# Patient Record
Sex: Female | Born: 1985 | Race: White | Hispanic: No | Marital: Married | Smoking: Never smoker
Health system: Southern US, Community
[De-identification: ages and names within clinical notes are randomized; demographics above are authoritative.]

## PROBLEM LIST (undated history)

## (undated) DIAGNOSIS — R319 Hematuria, unspecified: Secondary | ICD-10-CM

## (undated) DIAGNOSIS — O2240 Hemorrhoids in pregnancy, unspecified trimester: Secondary | ICD-10-CM

## (undated) HISTORY — PX: NOSE SURGERY: SHX723

---

## 2016-11-18 DIAGNOSIS — Z0000 Encounter for general adult medical examination without abnormal findings: Secondary | ICD-10-CM | POA: Diagnosis not present

## 2017-02-13 DIAGNOSIS — Z01419 Encounter for gynecological examination (general) (routine) without abnormal findings: Secondary | ICD-10-CM | POA: Diagnosis not present

## 2017-02-13 DIAGNOSIS — Z124 Encounter for screening for malignant neoplasm of cervix: Secondary | ICD-10-CM | POA: Diagnosis not present

## 2017-02-13 DIAGNOSIS — R87610 Atypical squamous cells of undetermined significance on cytologic smear of cervix (ASC-US): Secondary | ICD-10-CM | POA: Diagnosis not present

## 2017-02-13 DIAGNOSIS — Z6821 Body mass index (BMI) 21.0-21.9, adult: Secondary | ICD-10-CM | POA: Diagnosis not present

## 2017-08-08 DIAGNOSIS — Z124 Encounter for screening for malignant neoplasm of cervix: Secondary | ICD-10-CM | POA: Diagnosis not present

## 2018-04-02 DIAGNOSIS — Z3041 Encounter for surveillance of contraceptive pills: Secondary | ICD-10-CM | POA: Diagnosis not present

## 2018-04-02 DIAGNOSIS — K644 Residual hemorrhoidal skin tags: Secondary | ICD-10-CM | POA: Diagnosis not present

## 2018-04-02 DIAGNOSIS — H8111 Benign paroxysmal vertigo, right ear: Secondary | ICD-10-CM | POA: Diagnosis not present

## 2018-10-12 DIAGNOSIS — L989 Disorder of the skin and subcutaneous tissue, unspecified: Secondary | ICD-10-CM | POA: Diagnosis not present

## 2018-10-12 DIAGNOSIS — R300 Dysuria: Secondary | ICD-10-CM | POA: Diagnosis not present

## 2018-10-12 DIAGNOSIS — Z0000 Encounter for general adult medical examination without abnormal findings: Secondary | ICD-10-CM | POA: Diagnosis not present

## 2018-10-22 DIAGNOSIS — R319 Hematuria, unspecified: Secondary | ICD-10-CM | POA: Diagnosis not present

## 2018-11-30 DIAGNOSIS — R319 Hematuria, unspecified: Secondary | ICD-10-CM | POA: Diagnosis not present

## 2019-01-01 DIAGNOSIS — R3121 Asymptomatic microscopic hematuria: Secondary | ICD-10-CM | POA: Diagnosis not present

## 2019-01-18 DIAGNOSIS — R319 Hematuria, unspecified: Secondary | ICD-10-CM | POA: Diagnosis not present

## 2019-01-21 ENCOUNTER — Other Ambulatory Visit: Payer: Self-pay | Admitting: Family Medicine

## 2019-01-21 DIAGNOSIS — R3121 Asymptomatic microscopic hematuria: Secondary | ICD-10-CM

## 2019-01-28 ENCOUNTER — Ambulatory Visit
Admission: RE | Admit: 2019-01-28 | Discharge: 2019-01-28 | Disposition: A | Discharge status: Home / Self Care | Payer: Self-pay | Source: Ambulatory Visit | Attending: Family Medicine | Admitting: Family Medicine

## 2019-01-28 DIAGNOSIS — R3121 Asymptomatic microscopic hematuria: Secondary | ICD-10-CM | POA: Diagnosis not present

## 2019-02-07 DIAGNOSIS — Z20828 Contact with and (suspected) exposure to other viral communicable diseases: Secondary | ICD-10-CM | POA: Diagnosis not present

## 2019-03-21 DIAGNOSIS — Z20828 Contact with and (suspected) exposure to other viral communicable diseases: Secondary | ICD-10-CM | POA: Diagnosis not present

## 2019-10-19 DIAGNOSIS — Z0000 Encounter for general adult medical examination without abnormal findings: Secondary | ICD-10-CM | POA: Diagnosis not present

## 2019-10-19 DIAGNOSIS — Z130 Encounter for screening for diseases of the blood and blood-forming organs and certain disorders involving the immune mechanism: Secondary | ICD-10-CM | POA: Diagnosis not present

## 2019-10-19 DIAGNOSIS — Z13220 Encounter for screening for lipoid disorders: Secondary | ICD-10-CM | POA: Diagnosis not present

## 2019-10-19 DIAGNOSIS — Z131 Encounter for screening for diabetes mellitus: Secondary | ICD-10-CM | POA: Diagnosis not present

## 2019-11-18 DIAGNOSIS — Z6820 Body mass index (BMI) 20.0-20.9, adult: Secondary | ICD-10-CM | POA: Diagnosis not present

## 2019-11-18 DIAGNOSIS — Z1151 Encounter for screening for human papillomavirus (HPV): Secondary | ICD-10-CM | POA: Diagnosis not present

## 2019-11-18 DIAGNOSIS — Z01419 Encounter for gynecological examination (general) (routine) without abnormal findings: Secondary | ICD-10-CM | POA: Diagnosis not present

## 2020-03-29 DIAGNOSIS — Z20828 Contact with and (suspected) exposure to other viral communicable diseases: Secondary | ICD-10-CM | POA: Diagnosis not present

## 2020-04-07 DIAGNOSIS — J209 Acute bronchitis, unspecified: Secondary | ICD-10-CM | POA: Diagnosis not present

## 2020-09-24 DIAGNOSIS — Z20822 Contact with and (suspected) exposure to covid-19: Secondary | ICD-10-CM | POA: Diagnosis not present

## 2020-10-26 DIAGNOSIS — Z0000 Encounter for general adult medical examination without abnormal findings: Secondary | ICD-10-CM | POA: Diagnosis not present

## 2020-10-26 DIAGNOSIS — Z130 Encounter for screening for diseases of the blood and blood-forming organs and certain disorders involving the immune mechanism: Secondary | ICD-10-CM | POA: Diagnosis not present

## 2021-02-15 DIAGNOSIS — K529 Noninfective gastroenteritis and colitis, unspecified: Secondary | ICD-10-CM | POA: Diagnosis not present

## 2021-04-05 DIAGNOSIS — Z319 Encounter for procreative management, unspecified: Secondary | ICD-10-CM | POA: Diagnosis not present

## 2021-04-19 DIAGNOSIS — Z319 Encounter for procreative management, unspecified: Secondary | ICD-10-CM | POA: Diagnosis not present

## 2021-05-09 IMAGING — US US RENAL
1 series · 14 of 25 positions shown · non-contrast
Comparison: None

CLINICAL DATA: Asymptomatic microscopic hematuria

EXAM:
RENAL / URINARY TRACT ULTRASOUND COMPLETE

[Series 1: us renal · 0.23mm/px · 14 of 38 slices shown]
[im 1/38]
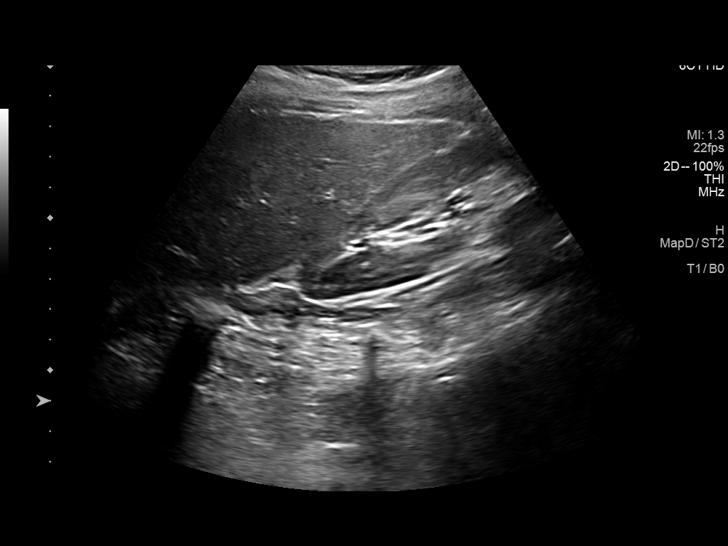
[im 4/38]
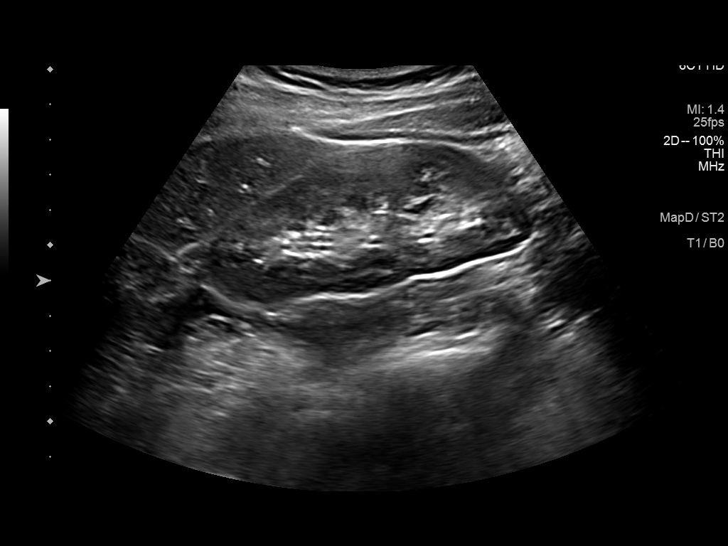
[im 7/38]
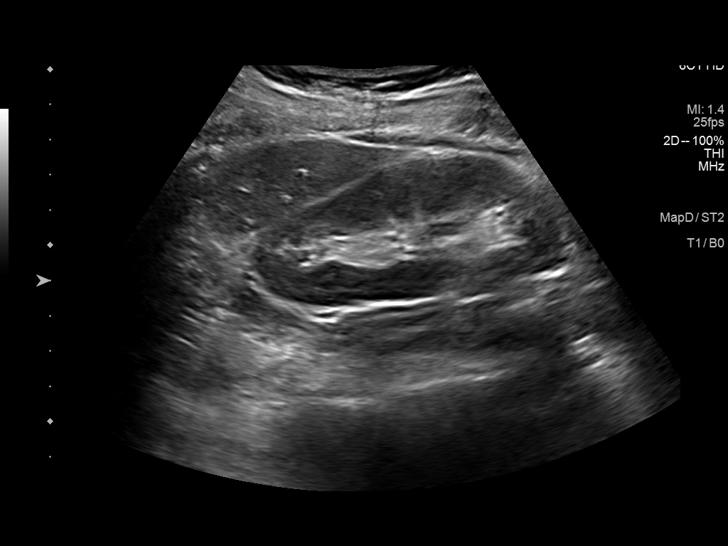
[im 10/38]
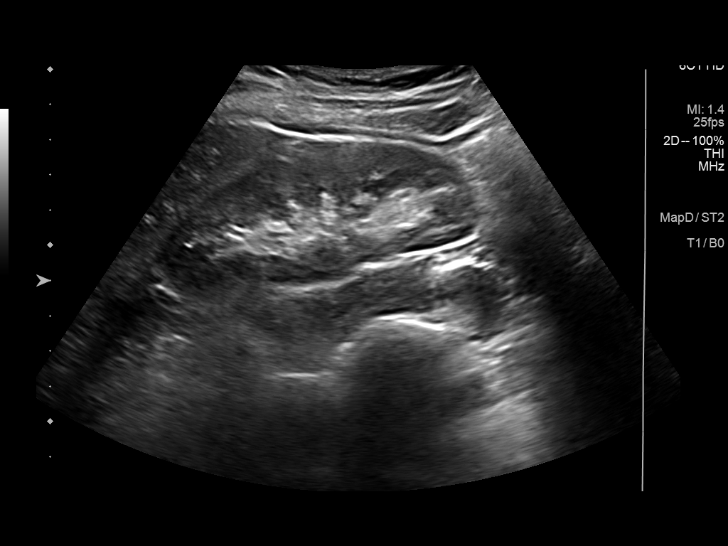
[im 13/38]
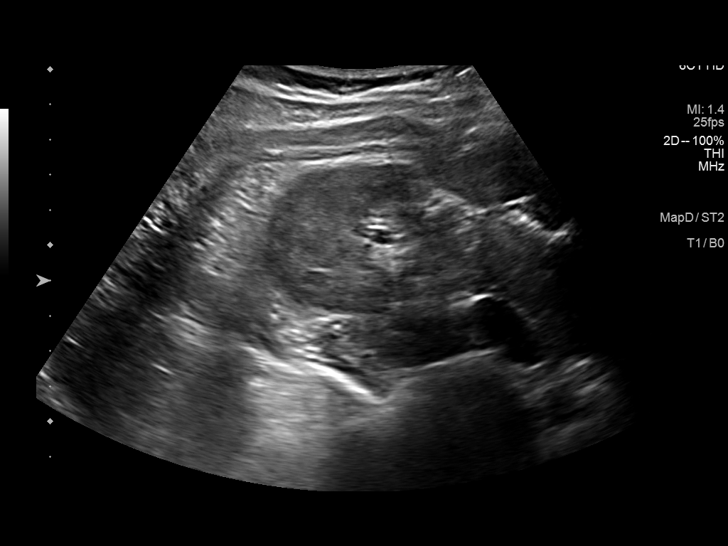
[im 14/38]
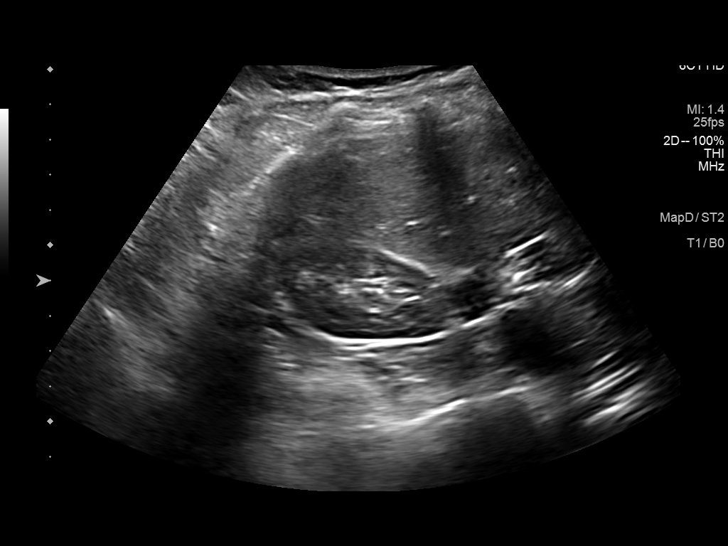
[im 17/38]
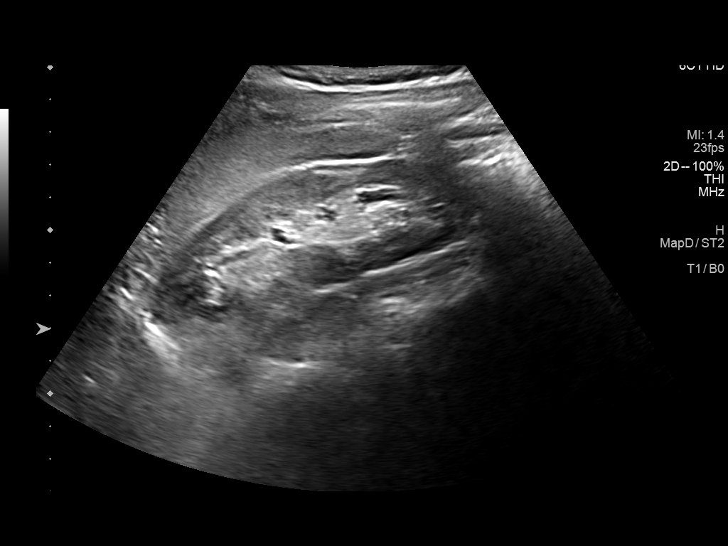
[im 21/38]
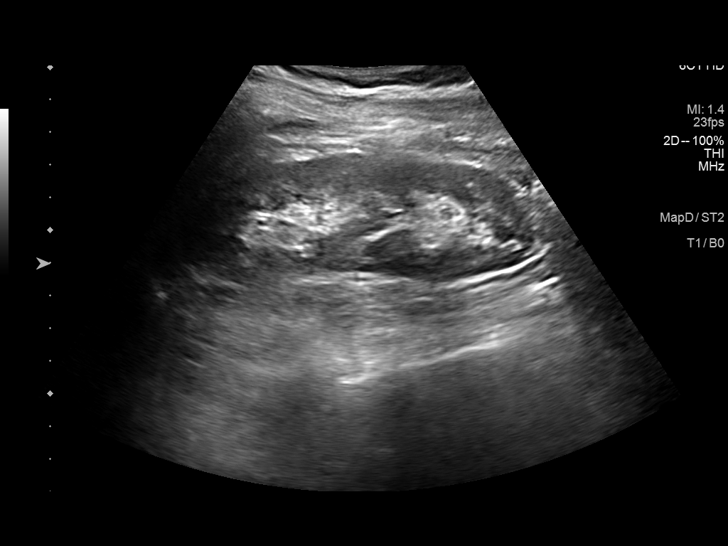
[im 24/38]
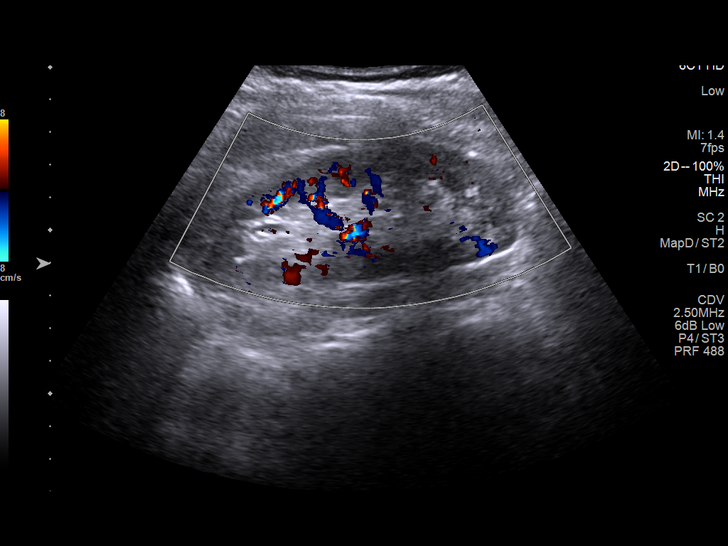
[im 25/38]
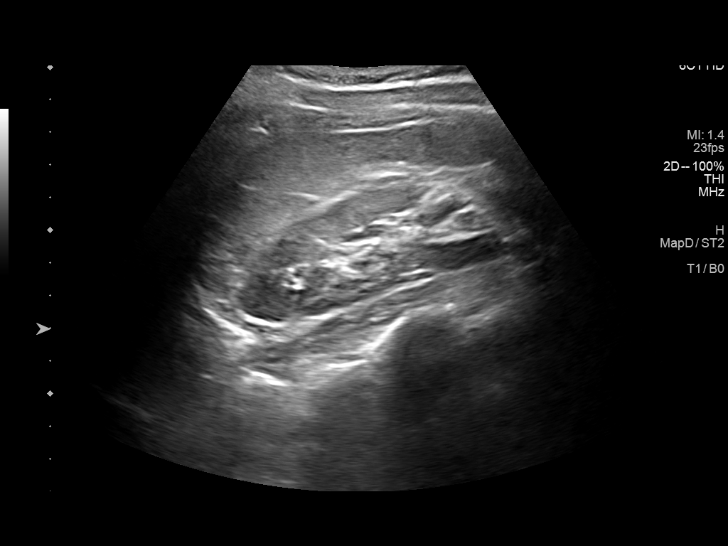
[im 28/38]
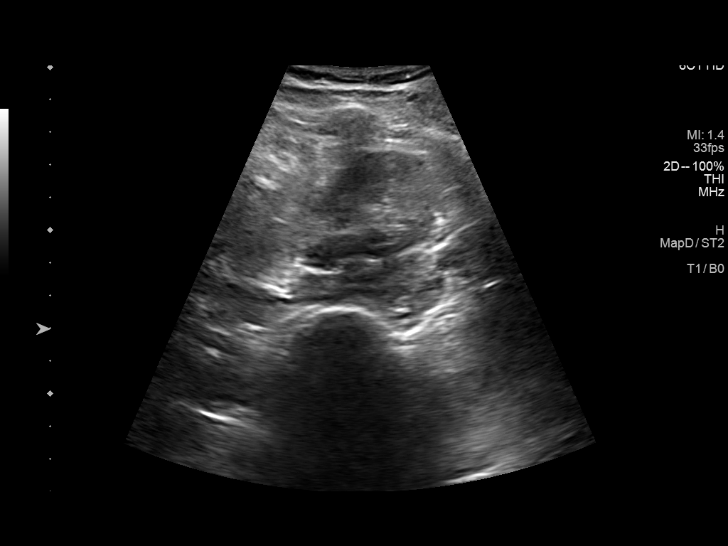
[im 31/38]
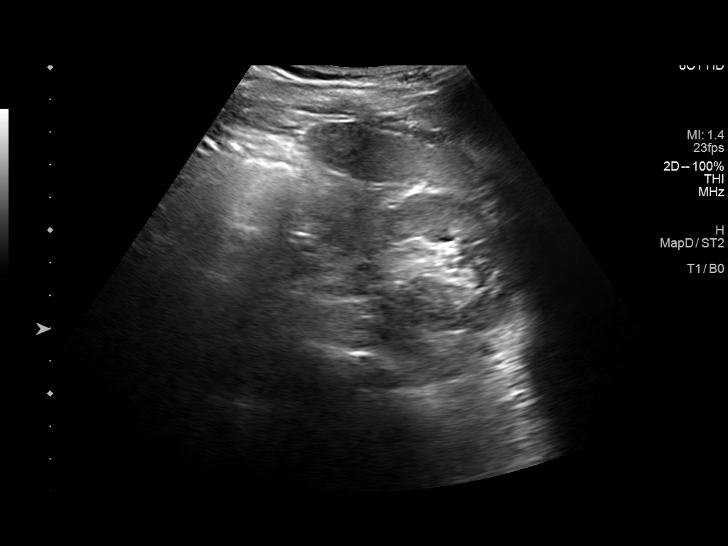
[im 34/38]
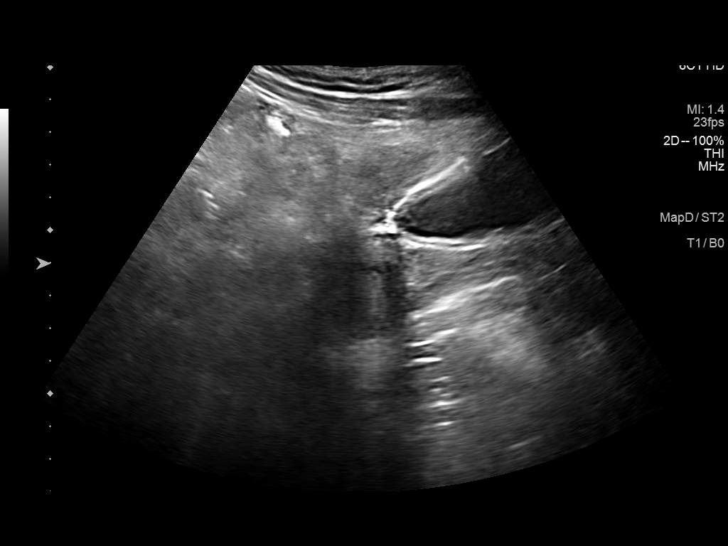
[im 38/38]
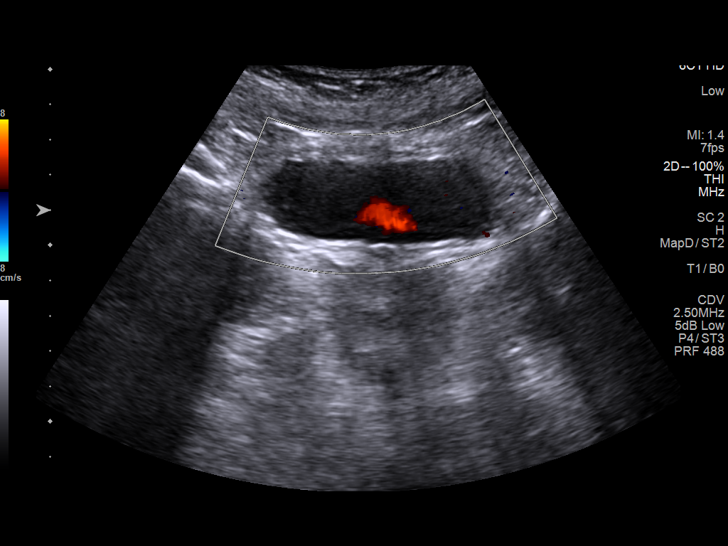

[14 of 25 positions shown; findings below may reference images not displayed]

FINDINGS: Right Kidney:

Renal measurements: 9.4 x 4.2 x 4.5 cm = volume: 91 mL. Normal
cortical thickness and echogenicity. No mass, hydronephrosis, or
shadowing calcification.

Left Kidney:

Renal measurements: 10.7 x 3.7 x 4.2 cm = volume: 88 mL. Normal
cortical thickness and echogenicity for technique. No mass,
hydronephrosis, or shadowing calcification.

Bladder:

Appears normal for degree of bladder distention. BILATERAL ureteral
jets noted.

Other:

N/A
IMPRESSION: Normal exam.

## 2021-08-09 DIAGNOSIS — Z3200 Encounter for pregnancy test, result unknown: Secondary | ICD-10-CM | POA: Diagnosis not present

## 2021-08-09 DIAGNOSIS — O26859 Spotting complicating pregnancy, unspecified trimester: Secondary | ICD-10-CM | POA: Diagnosis not present

## 2021-08-13 DIAGNOSIS — O26859 Spotting complicating pregnancy, unspecified trimester: Secondary | ICD-10-CM | POA: Diagnosis not present

## 2021-08-23 DIAGNOSIS — Z3201 Encounter for pregnancy test, result positive: Secondary | ICD-10-CM | POA: Diagnosis not present

## 2021-09-21 DIAGNOSIS — Z3689 Encounter for other specified antenatal screening: Secondary | ICD-10-CM | POA: Diagnosis not present

## 2021-09-21 DIAGNOSIS — Z3401 Encounter for supervision of normal first pregnancy, first trimester: Secondary | ICD-10-CM | POA: Diagnosis not present

## 2021-09-21 DIAGNOSIS — Z113 Encounter for screening for infections with a predominantly sexual mode of transmission: Secondary | ICD-10-CM | POA: Diagnosis not present

## 2021-09-21 LAB — OB RESULTS CONSOLE HEPATITIS B SURFACE ANTIGEN: Hepatitis B Surface Ag: NEGATIVE

## 2021-09-21 LAB — OB RESULTS CONSOLE GC/CHLAMYDIA
Chlamydia: NEGATIVE
Neisseria Gonorrhea: NEGATIVE

## 2021-09-21 LAB — OB RESULTS CONSOLE RUBELLA ANTIBODY, IGM: Rubella: IMMUNE

## 2021-09-21 LAB — HEPATITIS C ANTIBODY: HCV Ab: NEGATIVE

## 2021-09-21 LAB — OB RESULTS CONSOLE RPR: RPR: NONREACTIVE

## 2021-09-21 LAB — OB RESULTS CONSOLE HIV ANTIBODY (ROUTINE TESTING): HIV: NONREACTIVE

## 2021-10-25 DIAGNOSIS — Z361 Encounter for antenatal screening for raised alphafetoprotein level: Secondary | ICD-10-CM | POA: Diagnosis not present

## 2021-10-25 DIAGNOSIS — Z3A15 15 weeks gestation of pregnancy: Secondary | ICD-10-CM | POA: Diagnosis not present

## 2021-11-19 DIAGNOSIS — R8290 Unspecified abnormal findings in urine: Secondary | ICD-10-CM | POA: Diagnosis not present

## 2021-11-19 DIAGNOSIS — Z363 Encounter for antenatal screening for malformations: Secondary | ICD-10-CM | POA: Diagnosis not present

## 2022-01-14 DIAGNOSIS — Z23 Encounter for immunization: Secondary | ICD-10-CM | POA: Diagnosis not present

## 2022-02-04 DIAGNOSIS — Z23 Encounter for immunization: Secondary | ICD-10-CM | POA: Diagnosis not present

## 2022-02-04 DIAGNOSIS — Z3689 Encounter for other specified antenatal screening: Secondary | ICD-10-CM | POA: Diagnosis not present

## 2022-02-18 DIAGNOSIS — O2613 Low weight gain in pregnancy, third trimester: Secondary | ICD-10-CM | POA: Diagnosis not present

## 2022-02-18 DIAGNOSIS — Z3A32 32 weeks gestation of pregnancy: Secondary | ICD-10-CM | POA: Diagnosis not present

## 2022-03-22 ENCOUNTER — Other Ambulatory Visit: Payer: Self-pay | Admitting: Obstetrics and Gynecology

## 2022-03-22 DIAGNOSIS — O321XX9 Maternal care for breech presentation, other fetus: Secondary | ICD-10-CM | POA: Diagnosis not present

## 2022-03-22 DIAGNOSIS — Z3A36 36 weeks gestation of pregnancy: Secondary | ICD-10-CM | POA: Diagnosis not present

## 2022-03-26 ENCOUNTER — Encounter (HOSPITAL_COMMUNITY): Payer: Self-pay

## 2022-03-26 DIAGNOSIS — Z3689 Encounter for other specified antenatal screening: Secondary | ICD-10-CM | POA: Diagnosis not present

## 2022-03-26 NOTE — Patient Instructions (Signed)
BECKIE VISCARDI  03/26/2022   Your procedure is scheduled on:  04/07/2022  Arrive at 0730 at Entrance C on [employer] at [facility]  and [employer]. You are invited to use the FREE valet parking or use the Visitor's parking deck.  Pick up the phone at the desk and dial [phone].  Call this number if you have problems the morning of surgery: [phone]  Remember:   Do not eat food:(After Midnight) Desps de medianoche.  Do not drink clear liquids: (After Midnight) Desps de medianoche.  Take these medicines the morning of surgery with A SIP OF WATER:  none   Do not wear jewelry, make-up or nail polish.  Do not wear lotions, powders, or perfumes. Do not wear deodorant.  Do not shave 48 hours prior to surgery.  Do not bring valuables to the hospital.  [facility] is not   responsible for any belongings or valuables brought to the hospital.  Contacts, dentures or bridgework may not be worn into surgery.  Leave suitcase in the car. After surgery it may be brought to your room.  For patients admitted to the hospital, checkout time is 11:00 AM the day of              discharge.      Please read over the following fact sheets that you were given:     Preparing for Surgery

## 2022-03-27 ENCOUNTER — Other Ambulatory Visit: Payer: Self-pay | Admitting: Obstetrics & Gynecology

## 2022-03-27 ENCOUNTER — Ambulatory Visit: Payer: BC Managed Care – PPO | Attending: Obstetrics & Gynecology

## 2022-03-27 ENCOUNTER — Ambulatory Visit (HOSPITAL_BASED_OUTPATIENT_CLINIC_OR_DEPARTMENT_OTHER): Payer: BC Managed Care – PPO | Admitting: Maternal & Fetal Medicine

## 2022-03-27 ENCOUNTER — Ambulatory Visit: Payer: BC Managed Care – PPO | Admitting: *Deleted

## 2022-03-27 VITALS — BP 104/61 | HR 92 | Ht 62.0 in

## 2022-03-27 DIAGNOSIS — O3509X0 Maternal care for (suspected) other central nervous system malformation or damage in fetus, not applicable or unspecified: Secondary | ICD-10-CM

## 2022-03-27 DIAGNOSIS — Q048 Other specified congenital malformations of brain: Secondary | ICD-10-CM | POA: Diagnosis not present

## 2022-03-27 DIAGNOSIS — Z363 Encounter for antenatal screening for malformations: Secondary | ICD-10-CM

## 2022-03-27 DIAGNOSIS — Z3A37 37 weeks gestation of pregnancy: Secondary | ICD-10-CM | POA: Insufficient documentation

## 2022-03-27 NOTE — Progress Notes (Signed)
MFM Consult Note Patient Name: Janet Gibbs  Patient MRN:   [id number]  Referring provider: Erling Conte  Reason for Consult: Ventriculomegaly  HPI: Janet Gibbs is a 36 y.o. G1P0 at [redacted]w[redacted]d here for ultrasound and consultation.   Mild unilateral ventriculomegaly was seen on today's ultrasound.  The ventricle measures 11 millimeters (left) and 9 mm (right) and this is consistent with mild ventriculomegaly on the left. The choroids are not dangling within the lateral ventricles which is reassuring. The CSP was noted indicating a normal corpus callosum.  The brain parenchyma appears normal without signs of hemorrhage or tumor. The third ventricle appears normal. The posterior was normal without signs of spinabidfida although due to the gestational age the spine was not well seen. There is no evidence of soft markers or periventricular or hepatic calcifications. Fetal growth is normal. She has had negative toxo screening but not CMV.   I discussed the today's ultrasound findings are consistent with mild ventriculomegaly.  Some of the ultrasound views show bilateral ventriculomegaly while others only show unilateral ventriculomegaly.  Overall the measurements are mild and there is no other signs of intracranial abnormalities.  I discussed the various causes of ventriculomegaly but reassured the patient that this is most likely normal but that postnatal assessment is needed to monitor the newborn for any further workup.  I discussed that in general if there is moderate to severe urgent ventriculomegaly a fetal echo and fetal MRI are recommended but due to the late gestational age and the mild presentation that this is most likely not going to be beneficial and can be deferred at this time.  I also discussed the role of amniocentesis in assessing for CMV and toxoplasmosis in addition to a chromosomal MicroArray.  This was declined which I think is reasonable given the advanced gestation.  I discussed that  in the event this is mild ventriculomegaly there is some association with developmental and behavioral abnormalities but the severity is often difficult to appreciate.   Review of Systems: A review of systems was performed and was negative except per HPI   Vitals and Physical Exam See intake sheet for vitals Sitting comfortably on the sonogram table Nonlabored breathing Normal rate and rhythm Abdomen is nontender  Genetic testing: low risk NIPS  Sonographic findings Single intrauterine pregnancy. Observed fetal cardiac activity. Breech presentation. Fetal anatomy that was well seen appears normal except for mild left sided ventriculomegaly. The ventricle measures 11 millimeters (left) and 9 mm (right) and this is consistent with mild ventriculomegaly on the left. The choroids are not dangling within the lateral ventricles which is reassuring. The CSP was noted indicating a normal corpus callosum.  The brain parenchyma appears normal without signs of hemorrhage or tumor. The third ventricle appears normal. The posterior was normal without signs of spinabidfida although due to the gestational age the spine was not well seen. There is no evidence of soft markers or periventricular or hepatic calcifications. Fetal biometry shows the estimated fetal weight at the 72 percentile.  Amniotic fluid volume: Within normal limits. Placenta: Posterior. Cervix: Not visualized (advanced GA >24wks). Adnexa: No abnormality visualized.  Assessment - Ventriculomegaly, mild Plan -Due to gestational age a fetal echo, MRI and serial ultrasounds are not likely to be beneficial. -Notify pediatric team about mild ventriculomegaly after birth.  I discussed that potentially they would likely assess the newborn for CMV, toxoplasmosis and possibly genetic conditions associated with ventriculomegaly. - The mode of delivery should be individualized. In isolated VM without  significant head enlargement, the mode of  delivery should not be influenced by VM. In cases of severe VM with enlargement of fetal head size (head circumference >40 mm), cesarean birth may be indicated.  Due to persistent breech presentation in addition to the patient declining external cephalic version, a cesarean delivery is going to be scheduled between 39 and 40 weeks per the patient.   I spent 60 minutes reviewing the patients chart, including labs and images as well as counseling the patient about her medical conditions.  Braxton Feathers  MFM, [place]   03/27/2022  4:05 PM

## 2022-04-05 ENCOUNTER — Encounter (HOSPITAL_COMMUNITY)
Admission: RE | Admit: 2022-04-05 | Discharge: 2022-04-05 | Disposition: A | Discharge status: Home / Self Care | Payer: BC Managed Care – PPO | Source: Ambulatory Visit | Attending: Obstetrics and Gynecology | Admitting: Obstetrics and Gynecology

## 2022-04-05 DIAGNOSIS — Q048 Other specified congenital malformations of brain: Secondary | ICD-10-CM | POA: Diagnosis not present

## 2022-04-05 DIAGNOSIS — Z01818 Encounter for other preprocedural examination: Secondary | ICD-10-CM | POA: Insufficient documentation

## 2022-04-05 DIAGNOSIS — Z23 Encounter for immunization: Secondary | ICD-10-CM | POA: Diagnosis not present

## 2022-04-05 DIAGNOSIS — O9902 Anemia complicating childbirth: Secondary | ICD-10-CM | POA: Diagnosis not present

## 2022-04-05 DIAGNOSIS — Z3A39 39 weeks gestation of pregnancy: Secondary | ICD-10-CM | POA: Diagnosis not present

## 2022-04-05 DIAGNOSIS — O358XX0 Maternal care for other (suspected) fetal abnormality and damage, not applicable or unspecified: Secondary | ICD-10-CM | POA: Diagnosis not present

## 2022-04-05 DIAGNOSIS — O99824 Streptococcus B carrier state complicating childbirth: Secondary | ICD-10-CM | POA: Diagnosis not present

## 2022-04-05 DIAGNOSIS — O321XX0 Maternal care for breech presentation, not applicable or unspecified: Secondary | ICD-10-CM | POA: Diagnosis not present

## 2022-04-05 HISTORY — DX: Hemorrhoids in pregnancy, unspecified trimester: O22.40

## 2022-04-05 HISTORY — DX: Hematuria, unspecified: R31.9

## 2022-04-05 LAB — CBC
HCT: 37.5 % (ref 36.0–46.0)
Hemoglobin: 12.3 g/dL (ref 12.0–15.0)
MCH: 30 pg (ref 26.0–34.0)
MCHC: 32.8 g/dL (ref 30.0–36.0)
MCV: 91.5 fL (ref 80.0–100.0)
Platelets: 272 10*3/uL (ref 150–400)
RBC: 4.1 MIL/uL (ref 3.87–5.11)
RDW: 13 % (ref 11.5–15.5)
WBC: 9.7 10*3/uL (ref 4.0–10.5)
nRBC: 0 % (ref 0.0–0.2)

## 2022-04-05 LAB — TYPE AND SCREEN
ABO/RH(D): B POS
Antibody Screen: NEGATIVE

## 2022-04-05 LAB — RPR: RPR Ser Ql: NONREACTIVE

## 2022-04-07 ENCOUNTER — Encounter (HOSPITAL_COMMUNITY)
Admission: RE | Disposition: A | Discharge status: Home / Self Care | Payer: Self-pay | Source: Home / Self Care | Attending: Obstetrics and Gynecology

## 2022-04-07 ENCOUNTER — Other Ambulatory Visit: Payer: Self-pay

## 2022-04-07 ENCOUNTER — Encounter (HOSPITAL_COMMUNITY): Payer: Self-pay | Admitting: Obstetrics and Gynecology

## 2022-04-07 ENCOUNTER — Inpatient Hospital Stay (HOSPITAL_COMMUNITY): Payer: BC Managed Care – PPO | Admitting: Anesthesiology

## 2022-04-07 ENCOUNTER — Inpatient Hospital Stay (HOSPITAL_COMMUNITY)
Admission: RE | Admit: 2022-04-07 | Discharge: 2022-04-10 | DRG: 788 | Disposition: A | Discharge status: Home / Self Care | Payer: BC Managed Care – PPO | Attending: Obstetrics and Gynecology | Admitting: Obstetrics and Gynecology

## 2022-04-07 DIAGNOSIS — O358XX0 Maternal care for other (suspected) fetal abnormality and damage, not applicable or unspecified: Secondary | ICD-10-CM | POA: Diagnosis present

## 2022-04-07 DIAGNOSIS — O321XX0 Maternal care for breech presentation, not applicable or unspecified: Principal | ICD-10-CM | POA: Diagnosis present

## 2022-04-07 DIAGNOSIS — Z3A39 39 weeks gestation of pregnancy: Secondary | ICD-10-CM | POA: Diagnosis not present

## 2022-04-07 DIAGNOSIS — O99824 Streptococcus B carrier state complicating childbirth: Secondary | ICD-10-CM | POA: Diagnosis present

## 2022-04-07 DIAGNOSIS — B951 Streptococcus, group B, as the cause of diseases classified elsewhere: Secondary | ICD-10-CM | POA: Diagnosis present

## 2022-04-07 DIAGNOSIS — O9902 Anemia complicating childbirth: Secondary | ICD-10-CM | POA: Diagnosis present

## 2022-04-07 DIAGNOSIS — Z98891 History of uterine scar from previous surgery: Secondary | ICD-10-CM

## 2022-04-07 LAB — ABO/RH: ABO/RH(D): B POS

## 2022-04-07 SURGERY — Surgical Case
Anesthesia: Spinal

## 2022-04-07 MED ORDER — SCOPOLAMINE 1 MG/3DAYS TD PT72
MEDICATED_PATCH | TRANSDERMAL | Status: DC | PRN
  Administered 2022-04-07: 1 via TRANSDERMAL

## 2022-04-07 MED ORDER — ACETAMINOPHEN 10 MG/ML IV SOLN
INTRAVENOUS | Status: AC
  Filled 2022-04-07: qty 100

## 2022-04-07 MED ORDER — OXYTOCIN-SODIUM CHLORIDE 30-0.9 UT/500ML-% IV SOLN
2.5000 [IU]/h | INTRAVENOUS | Status: AC
  Administered 2022-04-07: 2.5 [IU]/h via INTRAVENOUS
  Filled 2022-04-07: qty 500

## 2022-04-07 MED ORDER — KETOROLAC TROMETHAMINE 30 MG/ML IJ SOLN
30.0000 mg | Freq: Four times a day (QID) | INTRAMUSCULAR | Status: AC
  Administered 2022-04-07 – 2022-04-08 (×3): 30 mg via INTRAVENOUS
  Filled 2022-04-07 (×2): qty 1

## 2022-04-07 MED ORDER — MORPHINE SULFATE (PF) 0.5 MG/ML IJ SOLN
INTRAMUSCULAR | Status: AC
  Filled 2022-04-07: qty 10

## 2022-04-07 MED ORDER — PHENYLEPHRINE 80 MCG/ML (10ML) SYRINGE FOR IV PUSH (FOR BLOOD PRESSURE SUPPORT)
PREFILLED_SYRINGE | INTRAVENOUS | Status: AC
  Filled 2022-04-07: qty 10

## 2022-04-07 MED ORDER — ONDANSETRON HCL 4 MG/2ML IJ SOLN
INTRAMUSCULAR | Status: AC
  Filled 2022-04-07: qty 2

## 2022-04-07 MED ORDER — WITCH HAZEL-GLYCERIN EX PADS: 1.0000 | MEDICATED_PAD | CUTANEOUS | Status: DC | PRN

## 2022-04-07 MED ORDER — HYDROMORPHONE HCL 1 MG/ML IJ SOLN: 0.2000 mg | INTRAMUSCULAR | Status: DC | PRN

## 2022-04-07 MED ORDER — SCOPOLAMINE 1 MG/3DAYS TD PT72: 1.0000 | MEDICATED_PATCH | Freq: Once | TRANSDERMAL | Status: DC

## 2022-04-07 MED ORDER — KETOROLAC TROMETHAMINE 30 MG/ML IJ SOLN
30.0000 mg | Freq: Once | INTRAMUSCULAR | Status: AC | PRN
  Administered 2022-04-07: 30 mg via INTRAVENOUS

## 2022-04-07 MED ORDER — DIBUCAINE (PERIANAL) 1 % EX OINT: 1.0000 | TOPICAL_OINTMENT | CUTANEOUS | Status: DC | PRN

## 2022-04-07 MED ORDER — PHENYLEPHRINE HCL-NACL 20-0.9 MG/250ML-% IV SOLN
INTRAVENOUS | Status: DC | PRN
  Administered 2022-04-07: 60 ug/min via INTRAVENOUS

## 2022-04-07 MED ORDER — MORPHINE SULFATE (PF) 0.5 MG/ML IJ SOLN
INTRAMUSCULAR | Status: DC | PRN
  Administered 2022-04-07: 150 ug via INTRATHECAL

## 2022-04-07 MED ORDER — ACETAMINOPHEN 500 MG PO TABS: 1000.0000 mg | ORAL_TABLET | Freq: Four times a day (QID) | ORAL | Status: DC

## 2022-04-07 MED ORDER — FENTANYL CITRATE (PF) 100 MCG/2ML IJ SOLN
INTRAMUSCULAR | Status: AC
  Filled 2022-04-07: qty 2

## 2022-04-07 MED ORDER — POVIDONE-IODINE 10 % EX SWAB
2.0000 | Freq: Once | CUTANEOUS | Status: AC
  Administered 2022-04-07: 2 via TOPICAL

## 2022-04-07 MED ORDER — DIPHENHYDRAMINE HCL 50 MG/ML IJ SOLN: 12.5000 mg | INTRAMUSCULAR | Status: DC | PRN

## 2022-04-07 MED ORDER — LACTATED RINGERS IV SOLN: INTRAVENOUS | Status: DC

## 2022-04-07 MED ORDER — NALOXONE HCL 4 MG/10ML IJ SOLN: 1.0000 ug/kg/h | INTRAVENOUS | Status: DC | PRN

## 2022-04-07 MED ORDER — SENNOSIDES-DOCUSATE SODIUM 8.6-50 MG PO TABS
2.0000 | ORAL_TABLET | Freq: Every day | ORAL | Status: DC
  Filled 2022-04-07: qty 2

## 2022-04-07 MED ORDER — SIMETHICONE 80 MG PO CHEW: 80.0000 mg | CHEWABLE_TABLET | ORAL | Status: DC | PRN

## 2022-04-07 MED ORDER — COCONUT OIL OIL: 1.0000 | TOPICAL_OIL | Status: DC | PRN

## 2022-04-07 MED ORDER — DEXAMETHASONE SODIUM PHOSPHATE 4 MG/ML IJ SOLN
INTRAMUSCULAR | Status: AC
  Filled 2022-04-07: qty 2

## 2022-04-07 MED ORDER — KETOROLAC TROMETHAMINE 30 MG/ML IJ SOLN: 30.0000 mg | Freq: Four times a day (QID) | INTRAMUSCULAR | Status: AC | PRN

## 2022-04-07 MED ORDER — OXYTOCIN-SODIUM CHLORIDE 30-0.9 UT/500ML-% IV SOLN
INTRAVENOUS | Status: DC | PRN
  Administered 2022-04-07: 400 mL via INTRAVENOUS

## 2022-04-07 MED ORDER — PHENYLEPHRINE HCL (PRESSORS) 10 MG/ML IV SOLN
INTRAVENOUS | Status: DC | PRN
  Administered 2022-04-07 (×2): 80 ug via INTRAVENOUS

## 2022-04-07 MED ORDER — KETOROLAC TROMETHAMINE 30 MG/ML IJ SOLN
INTRAMUSCULAR | Status: AC
  Filled 2022-04-07: qty 1

## 2022-04-07 MED ORDER — SODIUM CHLORIDE 0.9 % IR SOLN
Status: DC | PRN
  Administered 2022-04-07: 1

## 2022-04-07 MED ORDER — ACETAMINOPHEN 500 MG PO TABS
1000.0000 mg | ORAL_TABLET | Freq: Four times a day (QID) | ORAL | Status: DC
  Administered 2022-04-07 – 2022-04-10 (×10): 1000 mg via ORAL
  Filled 2022-04-07 (×11): qty 2

## 2022-04-07 MED ORDER — SODIUM CHLORIDE 0.9% FLUSH: 3.0000 mL | INTRAVENOUS | Status: DC | PRN

## 2022-04-07 MED ORDER — ZOLPIDEM TARTRATE 5 MG PO TABS: 5.0000 mg | ORAL_TABLET | Freq: Every evening | ORAL | Status: DC | PRN

## 2022-04-07 MED ORDER — ACETAMINOPHEN 10 MG/ML IV SOLN
INTRAVENOUS | Status: DC | PRN
  Administered 2022-04-07: 1000 mg via INTRAVENOUS

## 2022-04-07 MED ORDER — SCOPOLAMINE 1 MG/3DAYS TD PT72
MEDICATED_PATCH | TRANSDERMAL | Status: AC
  Filled 2022-04-07: qty 1

## 2022-04-07 MED ORDER — HYDROMORPHONE HCL 1 MG/ML IJ SOLN: 0.2500 mg | INTRAMUSCULAR | Status: DC | PRN

## 2022-04-07 MED ORDER — STERILE WATER FOR IRRIGATION IR SOLN
Status: DC | PRN
  Administered 2022-04-07: 1

## 2022-04-07 MED ORDER — OXYCODONE HCL 5 MG/5ML PO SOLN: 5.0000 mg | Freq: Once | ORAL | Status: DC | PRN

## 2022-04-07 MED ORDER — BUPIVACAINE IN DEXTROSE 0.75-8.25 % IT SOLN
INTRATHECAL | Status: DC | PRN
  Administered 2022-04-07: 1.4 mL via INTRATHECAL

## 2022-04-07 MED ORDER — IBUPROFEN 600 MG PO TABS
600.0000 mg | ORAL_TABLET | Freq: Four times a day (QID) | ORAL | Status: DC
  Administered 2022-04-08 – 2022-04-10 (×8): 600 mg via ORAL
  Filled 2022-04-07 (×8): qty 1

## 2022-04-07 MED ORDER — AMISULPRIDE (ANTIEMETIC) 5 MG/2ML IV SOLN: 10.0000 mg | Freq: Once | INTRAVENOUS | Status: DC | PRN

## 2022-04-07 MED ORDER — SODIUM CHLORIDE 0.9 % IV SOLN
INTRAVENOUS | Status: AC
  Filled 2022-04-07: qty 5

## 2022-04-07 MED ORDER — OXYCODONE HCL 5 MG PO TABS: 5.0000 mg | ORAL_TABLET | Freq: Once | ORAL | Status: DC | PRN

## 2022-04-07 MED ORDER — MENTHOL 3 MG MT LOZG: 1.0000 | LOZENGE | OROMUCOSAL | Status: DC | PRN

## 2022-04-07 MED ORDER — PHENYLEPHRINE HCL-NACL 20-0.9 MG/250ML-% IV SOLN
INTRAVENOUS | Status: AC
  Filled 2022-04-07: qty 250

## 2022-04-07 MED ORDER — CEFAZOLIN SODIUM-DEXTROSE 2-4 GM/100ML-% IV SOLN
2.0000 g | INTRAVENOUS | Status: AC
  Administered 2022-04-07: 2 g via INTRAVENOUS

## 2022-04-07 MED ORDER — ONDANSETRON HCL 4 MG/2ML IJ SOLN
INTRAMUSCULAR | Status: DC | PRN
  Administered 2022-04-07: 4 mg via INTRAVENOUS

## 2022-04-07 MED ORDER — OXYCODONE HCL 5 MG PO TABS: 5.0000 mg | ORAL_TABLET | ORAL | Status: DC | PRN

## 2022-04-07 MED ORDER — DEXAMETHASONE SODIUM PHOSPHATE 10 MG/ML IJ SOLN
INTRAMUSCULAR | Status: DC | PRN
  Administered 2022-04-07: 8 mg via INTRAVENOUS

## 2022-04-07 MED ORDER — MEPERIDINE HCL 25 MG/ML IJ SOLN: 6.2500 mg | INTRAMUSCULAR | Status: DC | PRN

## 2022-04-07 MED ORDER — OXYTOCIN-SODIUM CHLORIDE 30-0.9 UT/500ML-% IV SOLN
INTRAVENOUS | Status: AC
  Filled 2022-04-07: qty 500

## 2022-04-07 MED ORDER — ONDANSETRON HCL 4 MG/2ML IJ SOLN: 4.0000 mg | Freq: Three times a day (TID) | INTRAMUSCULAR | Status: DC | PRN

## 2022-04-07 MED ORDER — FENTANYL CITRATE (PF) 100 MCG/2ML IJ SOLN
INTRAMUSCULAR | Status: DC | PRN
  Administered 2022-04-07: 15 ug via INTRATHECAL

## 2022-04-07 MED ORDER — SIMETHICONE 80 MG PO CHEW
80.0000 mg | CHEWABLE_TABLET | Freq: Three times a day (TID) | ORAL | Status: DC
  Administered 2022-04-08 – 2022-04-09 (×4): 80 mg via ORAL
  Filled 2022-04-07 (×5): qty 1

## 2022-04-07 MED ORDER — PRENATAL MULTIVITAMIN CH
1.0000 | ORAL_TABLET | Freq: Every day | ORAL | Status: DC
  Administered 2022-04-08 – 2022-04-09 (×2): 1 via ORAL
  Filled 2022-04-07 (×3): qty 1

## 2022-04-07 MED ORDER — ONDANSETRON HCL 4 MG/2ML IJ SOLN: 4.0000 mg | Freq: Once | INTRAMUSCULAR | Status: DC | PRN

## 2022-04-07 MED ORDER — DIPHENHYDRAMINE HCL 25 MG PO CAPS: 25.0000 mg | ORAL_CAPSULE | Freq: Four times a day (QID) | ORAL | Status: DC | PRN

## 2022-04-07 MED ORDER — DIPHENHYDRAMINE HCL 25 MG PO CAPS: 25.0000 mg | ORAL_CAPSULE | ORAL | Status: DC | PRN

## 2022-04-07 MED ORDER — NALOXONE HCL 0.4 MG/ML IJ SOLN: 0.4000 mg | INTRAMUSCULAR | Status: DC | PRN

## 2022-04-07 SURGICAL SUPPLY — 26 items
BENZOIN TINCTURE PRP APPL 2/3 (GAUZE/BANDAGES/DRESSINGS) IMPLANT
CHLORAPREP W/TINT 26 (MISCELLANEOUS) ×2 IMPLANT
CLAMP UMBILICAL CORD (MISCELLANEOUS) ×1 IMPLANT
DRSG OPSITE POSTOP 4X10 (GAUZE/BANDAGES/DRESSINGS) ×1 IMPLANT
ELECT REM PT RETURN 9FT ADLT (ELECTROSURGICAL) ×1
ELECTRODE REM PT RTRN 9FT ADLT (ELECTROSURGICAL) ×1 IMPLANT
GLOVE BIOGEL PI IND STRL 7.0 (GLOVE) ×3 IMPLANT
GLOVE ECLIPSE 6.5 STRL STRAW (GLOVE) ×1 IMPLANT
GOWN STRL REUS W/TWL LRG LVL3 (GOWN DISPOSABLE) ×2 IMPLANT
HEMOSTAT ARISTA ABSORB 3G PWDR (HEMOSTASIS) IMPLANT
NDL HYPO 25X5/8 SAFETYGLIDE (NEEDLE) IMPLANT
NEEDLE HYPO 25X5/8 SAFETYGLIDE (NEEDLE) ×1 IMPLANT
NS IRRIG 1000ML POUR BTL (IV SOLUTION) ×1 IMPLANT
PACK C SECTION WH (CUSTOM PROCEDURE TRAY) ×1 IMPLANT
PAD OB MATERNITY 4.3X12.25 (PERSONAL CARE ITEMS) ×1 IMPLANT
STRIP CLOSURE SKIN 1/2X4 (GAUZE/BANDAGES/DRESSINGS) IMPLANT
SUT MNCRL 0 VIOLET CTX 36 (SUTURE) ×2 IMPLANT
SUT MONOCRYL 0 CTX 36 (SUTURE) ×2
SUT PLAIN 2 0 (SUTURE) ×1
SUT PLAIN ABS 2-0 CT1 27XMFL (SUTURE) ×1 IMPLANT
SUT VIC AB 0 CT1 27 (SUTURE) ×1
SUT VIC AB 0 CT1 27XBRD ANBCTR (SUTURE) ×1 IMPLANT
SUT VIC AB 4-0 KS 27 (SUTURE) ×1 IMPLANT
TOWEL OR 17X24 6PK STRL BLUE (TOWEL DISPOSABLE) ×1 IMPLANT
TRAY FOLEY W/BAG SLVR 14FR LF (SET/KITS/TRAYS/PACK) IMPLANT
WATER STERILE IRR 1000ML POUR (IV SOLUTION) ×1 IMPLANT

## 2022-04-07 NOTE — Anesthesia Preprocedure Evaluation (Addendum)
Anesthesia Evaluation  Patient identified by MRN, date of birth, ID band Patient awake    Reviewed: Allergy & Precautions, NPO status , Patient's Chart, lab work & pertinent test results  Airway Mallampati: II  TM Distance: >3 FB Neck ROM: Full    Dental no notable dental hx.    Pulmonary neg pulmonary ROS   Pulmonary exam normal breath sounds clear to auscultation       Cardiovascular negative cardio ROS Normal cardiovascular exam Rhythm:Regular Rate:Normal     Neuro/Psych negative neurological ROS  negative psych ROS   GI/Hepatic negative GI ROS, Neg liver ROS,,,  Endo/Other  negative endocrine ROS    Renal/GU negative Renal ROS  negative genitourinary   Musculoskeletal negative musculoskeletal ROS (+)  Hb 12.3, plt 272   Abdominal   Peds  Hematology negative hematology ROS (+) Hb 12.3, plt 272   Anesthesia Other Findings   Reproductive/Obstetrics (+) Pregnancy breech                             Anesthesia Physical Anesthesia Plan  ASA: 2  Anesthesia Plan: Spinal   Post-op Pain Management: Regional block, Toradol IV (intra-op)* and Ofirmev IV (intra-op)*   Induction:   PONV Risk Score and Plan: 3 and Ondansetron, Dexamethasone and Treatment may vary due to age or medical condition  Airway Management Planned: Natural Airway and Nasal Cannula  Additional Equipment: None  Intra-op Plan:   Post-operative Plan:   Informed Consent: I have reviewed the patients History and Physical, chart, labs and discussed the procedure including the risks, benefits and alternatives for the proposed anesthesia with the patient or authorized representative who has indicated his/her understanding and acceptance.       Plan Discussed with: CRNA  Anesthesia Plan Comments:        Anesthesia Quick Evaluation

## 2022-04-07 NOTE — Op Note (Signed)
Janet Gibbs 1985-08-31 [id number]  OPERATIVE NOTE  PROCEDURE: primary low transverse cesarean section  PRE-OPERATIVE DIAGNOSIS:  Single intrauterine pregnancy at 39 weeks 0 days Persistent breech presentation  POST-OPERATIVE DIAGNOSIS: As above, delivered  SURGEON: Laurajean Hosek,DO  ASSISTANT: Dorisann Frames, CNM  FINDINGS: normal female gravid anatomy including uterus, bilateral fallopian tubes and ovaries. Norma intrauterine cavity without any appreciable anomaly that would have explained persistent breech. Viable healthy female fetus in the frank breech presentation left sacrum anterior, apgars of 8 and 9 weight 7 pounds 4 ounces  EBL: 198 cc  FLUIDS: 300 cc LR  MEDICATIONS: Ancef 2 g  URINE OUTPUT: 200 cc clear  COMPLICATIONS: None  PROCEDURE IN DETAIL:  After the patient was appropriately consented she was taken to the operating room where regional anesthesia was obtained without complications. The patient was placed in the dorsal supine position with leftward tilt. Fetal heart tones were obtained and found to be reassuring. A Foley catheter was placed and the bladder was drained for clear yellow urine and remained in place for the duration of the procedure. The patient was prepped and draped in the usual sterile fashion. An appropriate time out was performed that verified the correct patient, procedure, and surgical team.   The scalpel was used to make a low transverse skin incision. The incision was carried down to the fascia, maintaining hemostasis with the Bovie as needed, The fascia was incised to the left and the right of the midline. The fascia incision was carried laterally using the curved Mayo scissors on either side. The inferior aspect of the fascia was grasped with the Kocher clamps, tented upwards, and the underlying rectus muscle dissected off bluntly and sharply with curved Mayo scissors. The Kocher clamps were removed, placed on the superior aspect of the  fascia and the rectus muscles dissected off in a similar fashion. The rectus muscles were divided at the midline bluntly. The peritoneum was identified, grasped with hemostat clamps and entered sharply with the Metzenbaum scissors. The incision was then extended laterally by bluntly stretching. The bladder blade retractor was introduced. The vesicouterine peritoneum was dissected off the lower uterine segment and a bladder flap was created digitally. The scalpel was used to make a low transverse uterine incision. The incision was extended caudad and cephalad by bluntly stretching. The amniotic membranes were ruptured for clear fluid. The infant was found to be in the frank breech presentation. Breech delivery in the usual fashion with delivery of the left sacrum anterior through the hysterotomy, delivery of the body down to the level of the scapula and arms delivered individually maintaining flexion at the shoulder and elbow. Final delivery of the fetal head maintaining flexion at the neck without difficulty. The infant was dried and stimulated while performing 60 seconds of delayed cord clamping. The umbilical cord was then double clamped and cut and the infant handed off to the neonatal team. The placenta was delivered by manual extraction intact. The uterine cavity was cleared of any clot and debris. No obvious uterine anomalies appreciated. The hysterotomy was closed using 0-Monocryl in a running locking fashion. A second layer closure was performed using the same suture. There was noted to be an extension in the broad ligament on the left side however no active bleeding appreciated so no additional sutures placed in the broad. Arista was placed in the area for additional hemostasis. Adequate hemostasis of the hysterotomy was noted. The bilateral gutters were cleared of all clot and debris. Bilateral tubes and  ovaries were inspected and found to be within normal limits. The underlying fascia planes were  inspected and found to be hemostatic. The fascia was closed with 0-Vicryl a normal running fashion. The subcutaneous layer was noted to be hemostatic and thin so no additional closure needed. The skin layer was closed with 4-0 Vicryl. The patient tolerated the procedure well and was taken to the recovery room in stable condition. All instrument, needle, and sponge counts were correct.   Miroslav Gin A Dunbar Buras 04/07/22 12:02 PM

## 2022-04-07 NOTE — H&P (Signed)
Janet Gibbs is a 37 y.o. female G64P0 [redacted]w[redacted]d presenting for scheduled cesarean section for breech.  Pregnancy dated by LMP c/w 6w sono. Routine prenatal care at [facility] with Dr. Benjie Karvonen as primary. She had prenatal labs WNL. Was treated for asymptomatic bacteriuria +GBS in urine in first trimester. She had low risk NIPS and negative Horizon 14 panel carrier screen. Low risk AFP1 and normal fetal anatomy scan. Normal 3rd trimester labs. Growth [place] at 32 weeks showed breech presentation with an EFW of 4#3 (37%). On follow up US for presentation check at 36w persistent breech noted but also new finding of bilateral ventriculomegaly noted 46mm and 12mm. She was seen by MFM on 12/27 at 37w showed left ventricle of 56mm and right ventricle of 42mm with otherwise normal head/brain anatomy and again breech presentation noted. 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 Per MFM recommend neonatal follow up with Peds. Patient was counseled on and declined ECV. Otherwise uncomplicated pregnancy and PMH.No previous abdominal surgeries.    OB History     Gravida  1   Para      Term      Preterm      AB      Living         SAB      IAB      Ectopic      Multiple      Live Births             Past Medical History:  Diagnosis Date   Hematuria    Hemorrhoids during pregnancy    Past Surgical History:  Procedure Laterality Date   NOSE SURGERY     Family History: family history includes COPD in her maternal grandmother; Cancer in her father; Diabetes in her maternal grandmother; Heart disease in her paternal grandfather; Hypertension in her father and mother; Stroke in her paternal grandmother. Social History:  reports that she has never smoked. She has never used smokeless tobacco. She reports that she does not currently use alcohol. She reports that she does not use  drugs.     Maternal Diabetes: No Genetic Screening: Normal Maternal Ultrasounds/Referrals: Other: Fetal Ultrasounds or other Referrals:  Referred to Materal Fetal Medicine  Maternal Substance Abuse:  No Significant Maternal Medications:  None Significant Maternal Lab Results:  Group B Strep positive Other Comments:  None  Review of Systems  All other systems reviewed and are negative.  Per HPI Maternal Exam:  Uterine Assessment: Contraction frequency is rare.  Abdomen: Patient reports no abdominal tenderness. Estimated fetal weight is 6#14.   Fetal presentation: breech   Fetal Exam Fetal State Assessment: Category I - tracings are normal.   Physical Exam Vitals reviewed.  Constitutional:      Appearance: Normal appearance.  HENT:     Head: Normocephalic.  Cardiovascular:     Rate and Rhythm: Normal rate.  Pulmonary:     Effort: Pulmonary effort is normal.  Abdominal:     Tenderness: There is no abdominal tenderness.  Musculoskeletal:        General: Normal range of motion.     Cervical back: Normal range of motion.  Skin:    General: Skin is warm and dry.  Neurological:     General: No focal deficit present.     Mental Status: She is alert and oriented to person, place, and time.  Psychiatric:        Mood and Affect: Mood normal.        Behavior: Behavior normal.       Blood pressure 119/84, pulse (!) 104, temperature 98.7 F (37.1 C), temperature source Oral, resp. rate 16, height 5\' 4"  (1.626 m), weight 62.6 kg, SpO2 99 %.  Prenatal labs: ABO, Rh:  --/--/B POS Performed at [facility], [address] [place], [place]  [phone] (11/94) Antibody: NEG (01/05 04-21-1981) Rubella: Immune (06/23 0000) RPR: NON REACTIVE (01/05 0945)  HBsAg: Negative (06/23 0000)  HIV: Non-reactive (06/23 0000)  GBS:   POS by urine  ChemistryNo results for input(s): "NA", "K", "CL", "CO2", "GLUCOSE", "BUN", "CREATININE", "CALCIUM", "GFRNONAA", "GFRAA", "ANIONGAP"  in the last 168 hours.  No results for input(s): "PROT", "ALBUMIN", "AST", "ALT", "ALKPHOS", "BILITOT" in the last 168 hours. Hematology Recent Labs  Lab 04/05/22 0945  WBC 9.7  RBC 4.10  HGB 12.3  HCT 37.5  MCV 91.5  MCH 30.0  MCHC 32.8  RDW 13.0  PLT 272   Cardiac EnzymesNo results for input(s): "TROPONINI" in the last 168 hours. No results for input(s): "TROPIPOC" in the last 168 hours.  BNPNo results for input(s): "BNP", "PROBNP" in the last 168 hours.  DDimer No results for input(s): "DDIMER" in the last 168 hours.   Assessment/Plan: Janet Gibbs is a 37 y.o. female G1P0 [redacted]w[redacted]d admitted for scheduled primary cesarean section for breech.   Patient has been counseled both in the office throughout the pregnancy and again in the preoperative holding unit rgarding risks and benefits of cesarean section. She has also been counseled on risks/benefits of ECV for breech. She declines ECV and requests primary cesarean section for breech. Counseled on risks of surgery including but not limited to bleeding, infection, damage to surrounding structures, risks to future pregnancies, VTE, and inherit risks of anesthesia. She has been counseled on and consents to blood products if needed. She verbalizes good understanding of these risks, all questions answered, and consents signed at bedside.  -Admit to OR -NPO -Ancef 2 g abx ppx -SCD VTE ppx -Fetal b/l ventriculomegaly in third trimester: neonatal follow up -GBS+ by urine -RH POS, Rubella Imm -Routine intra-op/postop care  Micheil Klaus A Amanda Pote 04/07/2022, 9:20 AM

## 2022-04-07 NOTE — Anesthesia Postprocedure Evaluation (Signed)
Anesthesia Post Note  Patient: Janet Gibbs  Procedure(s) Performed: Primary CESAREAN SECTION     Patient location during evaluation: PACU Anesthesia Type: Spinal Level of consciousness: awake and alert and oriented Pain management: pain level controlled Vital Signs Assessment: post-procedure vital signs reviewed and stable Respiratory status: spontaneous breathing, nonlabored ventilation and respiratory function stable Cardiovascular status: blood pressure returned to baseline and stable Postop Assessment: no headache, no backache, spinal receding and no apparent nausea or vomiting Anesthetic complications: no  No notable events documented.  Last Vitals:  Vitals:   04/07/22 1415 04/07/22 1520  BP: 112/66 106/65  Pulse:    Resp: 17 17  Temp: 36.8 C 36.8 C  SpO2: 98% 96%    Last Pain:  Vitals:   04/07/22 1520  TempSrc:   PainSc: 0-No pain   Pain Goal:                Epidural/Spinal Function Cutaneous sensation: Able to Wiggle Toes (04/07/22 1520), Patient able to flex knees: Yes (04/07/22 1520), Patient able to lift hips off bed: Yes (04/07/22 1520), Back pain beyond tenderness at insertion site: No (04/07/22 1520), Progressively worsening motor and/or sensory loss: No (04/07/22 1520), Bowel and/or bladder incontinence post epidural: No (04/07/22 Cornelius)  Pervis Hocking

## 2022-04-07 NOTE — Lactation Note (Signed)
This note was copied from a baby's chart. Lactation Consultation Note  Patient Name: Janet Gibbs INOMV'E Date: 04/07/2022 Reason for consult: Initial assessment;Primapara;1st time breastfeeding;Term Age:35 hours Per mom baby had fed after birth. As LC entered the room  mom starting to latch and LC noted the baby had a wet diaper. LC offered to help dad change the diaper. After changing the diaper , LC and orientee offered assistance to latch, mom receptive.  Per mom felt comfortable with hand expressing, and baby latched easily with depth, swallows noted and per mom comfortable. After baby fed 17 mins and released , rooting , mom switched the baby to the right with little assistance and baby still feeding. Latch scores 8 x 2.  LC reviewed Breast feeding basics.   Maternal Data Has patient been taught Hand Expression?: Yes (mom able to hand express by herself) Does the patient have breastfeeding experience prior to this delivery?: No  Feeding Mother's Current Feeding Choice: Breast Milk  LATCH Score Latch: Grasps breast easily, tongue down, lips flanged, rhythmical sucking. (switched to the right breast)  Audible Swallowing: A few with stimulation  Type of Nipple: Everted at rest and after stimulation  Comfort (Breast/Nipple): Soft / non-tender  Hold (Positioning): Assistance needed to correctly position infant at breast and maintain latch.  LATCH Score: 8   Lactation Tools Discussed/Used    Interventions  Breast feeding basics , education , assisted to latch   Discharge Pump: DEBP;Personal WIC Program: No  Consult Status Consult Status: Follow-up Date: 04/07/22 Follow-up type: In-patient    [place] 04/07/2022, 3:10 PM

## 2022-04-07 NOTE — Anesthesia Procedure Notes (Signed)
Spinal  Patient location during procedure: OR Start time: 04/07/2022 10:50 AM End time: 04/07/2022 10:55 AM Reason for block: surgical anesthesia Staffing Performed: anesthesiologist  Anesthesiologist: Pervis Hocking, DO Performed by: Pervis Hocking, DO Authorized by: Pervis Hocking, DO   Preanesthetic Checklist Completed: patient identified, IV checked, risks and benefits discussed, surgical consent, monitors and equipment checked, pre-op evaluation and timeout performed Spinal Block Patient position: sitting Prep: DuraPrep and site prepped and draped Patient monitoring: cardiac monitor, continuous pulse ox and blood pressure Approach: midline Location: L3-4 Injection technique: single-shot Needle Needle type: Pencan  Needle gauge: 24 G Needle length: 9 cm Assessment Sensory level: T6 Events: CSF return Additional Notes Functioning IV was confirmed and monitors were applied. Sterile prep and drape, including hand hygiene and sterile gloves were used. The patient was positioned and the spine was prepped. The skin was anesthetized with lidocaine.  Free flow of clear CSF was obtained prior to injecting local anesthetic into the CSF.  The spinal needle aspirated freely following injection.  The needle was carefully withdrawn.  The patient tolerated the procedure well.

## 2022-04-07 NOTE — Transfer of Care (Signed)
Immediate Anesthesia Transfer of Care Note  Patient: Janet Gibbs  Procedure(s) Performed: Primary CESAREAN SECTION  Patient Location: PACU  Anesthesia Type:Spinal  Level of Consciousness: awake, alert , and oriented  Airway & Oxygen Therapy: Patient Spontanous Breathing  Post-op Assessment: Report given to RN and Post -op Vital signs reviewed and stable  Post vital signs: Reviewed and stable  Last Vitals:  Vitals Value Taken Time  BP 132/66 04/07/22 1224  Temp    Pulse 72 04/07/22 1226  Resp 14 04/07/22 1226  SpO2 99 % 04/07/22 1226  Vitals shown include unvalidated device data.  Last Pain:  Vitals:   04/07/22 0813  TempSrc: Oral  PainSc: 0-No pain         Complications: No notable events documented.

## 2022-04-08 LAB — CBC
HCT: 28 % — ABNORMAL LOW (ref 36.0–46.0)
Hemoglobin: 9.5 g/dL — ABNORMAL LOW (ref 12.0–15.0)
MCH: 30.3 pg (ref 26.0–34.0)
MCHC: 33.9 g/dL (ref 30.0–36.0)
MCV: 89.2 fL (ref 80.0–100.0)
Platelets: 200 10*3/uL (ref 150–400)
RBC: 3.14 MIL/uL — ABNORMAL LOW (ref 3.87–5.11)
RDW: 13 % (ref 11.5–15.5)
WBC: 19 10*3/uL — ABNORMAL HIGH (ref 4.0–10.5)
nRBC: 0 % (ref 0.0–0.2)

## 2022-04-08 NOTE — Lactation Note (Signed)
This note was copied from a baby's chart. Lactation Consultation Note  Patient Name: Janet Gibbs XIPJA'S Date: 04/08/2022 Reason for consult: Follow-up assessment;Primapara;Term Age:35 hours Baby on the breast when LC came into rm. Mom stated the baby has been cluster feeding and wants to stay on the breast almost constantly. Newborn feeding habits, behavior, STS, I&O, supply and demand reviewed. Praised mom for BF so well and baby had good body alignment. Answered mom and dads questions. Encouraged to call for assistance if needed. Maternal Data Does the patient have breastfeeding experience prior to this delivery?: No  Feeding    LATCH Score Latch: Grasps breast easily, tongue down, lips flanged, rhythmical sucking.  Audible Swallowing: A few with stimulation  Type of Nipple: Everted at rest and after stimulation  Comfort (Breast/Nipple): Soft / non-tender  Hold (Positioning): No assistance needed to correctly position infant at breast.  LATCH Score: 9   Lactation Tools Discussed/Used    Interventions Interventions: Breast feeding basics reviewed  Discharge    Consult Status Consult Status: Follow-up Date: 04/09/22 Follow-up type: In-patient    Theodoro Kalata 04/08/2022, 10:51 PM

## 2022-04-08 NOTE — Progress Notes (Signed)
Subjective: Postpartum Day 1: Cesarean Delivery Patient reports nausea, incisional pain, tolerating PO, + flatus, and no problems voiding.    Objective: Vital signs in last 24 hours: Temp:  [97.8 F (36.6 C)-98.3 F (36.8 C)] 97.8 F (36.6 C) (01/08 0439) Pulse Rate:  [63-87] 64 (01/08 0439) Resp:  [9-24] 18 (01/08 0439) BP: (96-132)/(53-79) 98/64 (01/08 0439) SpO2:  [94 %-99 %] 99 % (01/08 0439)  Physical Exam:  General: alert, cooperative, and appears stated age Lochia: appropriate Uterine Fundus: firm Incision: healing well DVT Evaluation: No evidence of DVT seen on physical exam.  Recent Labs    04/08/22 0442  HGB 9.5*  HCT 28.0*    Assessment/Plan: Status post Cesarean section. Doing well postoperatively.  Continue current care.  Lovenia Kim, MD 04/08/2022, 10:19 AM

## 2022-04-09 LAB — BIRTH TISSUE RECOVERY COLLECTION (PLACENTA DONATION)

## 2022-04-09 NOTE — Procedures (Signed)
Mom told to supplement after each feeding per Marinell Blight NP . Feeding guidelines discussed amounts.

## 2022-04-09 NOTE — Lactation Note (Addendum)
This note was copied from a baby's chart. Lactation Consultation Note  Patient Name: Janet Gibbs [id number] Date: 04/09/2022 Reason for consult: Follow-up assessment;1st time breastfeeding Age:35 hours  P1, Baby was cluster feeding and family became worried about mother's milk supply.  Family started supplementation with formula last night due to worries about milk supply.  Reassured parents and provided edcuation regarding milk volume and transition. Baby latched with ease.  Provided formula supplementation guidelines.  Encouraged mother to offer breast first and formula after if desired.  Discussed pumping once home to help increase milk supply if worries continue. Reviewed engorgement care and monitoring voids/stools.   Maternal Data Has patient been taught Hand Expression?: Yes Does the patient have breastfeeding experience prior to this delivery?: No  Feeding Mother's Current Feeding Choice: Breast Milk and Formula  LATCH Score Latch: Grasps breast easily, tongue down, lips flanged, rhythmical sucking.  Audible Swallowing: A few with stimulation  Type of Nipple: Everted at rest and after stimulation  Comfort (Breast/Nipple): Soft / non-tender  Hold (Positioning): Assistance needed to correctly position infant at breast and maintain latch.  LATCH Score: 8   Lactation Tools Discussed/Used    Interventions Interventions: Breast feeding basics reviewed;Assisted with latch;Skin to skin;Education  Discharge Pump: Personal;DEBP (Spectra)  Consult Status Consult Status: Complete Date: 04/09/22    Vivianne Master Boschen 04/09/2022, 10:05 AM

## 2022-04-09 NOTE — Progress Notes (Signed)
No c/o; pain controlled Tol po, ambulating some but not moving much sine it hurts to do so, +flatus Breastfeeding; nml lochia  Patient Vitals for the past 24 hrs:  BP Temp Temp src Pulse Resp SpO2  04/09/22 0528 104/65 98 F (36.7 C) Oral 66 18 98 %  04/08/22 2113 103/65 99.2 F (37.3 C) Oral 69 18 98 %  04/08/22 1337 98/60 98.2 F (36.8 C) Oral 71 17 100 %   A&ox3 Rrr Ctab Abd: soft,nd,nt; fundus firm and below umb LE: no edema, nt bilat     Latest Ref Rng & Units 04/08/2022    4:42 AM 04/05/2022    9:45 AM  CBC  WBC 4.0 - 10.5 K/uL 19.0  9.7   Hemoglobin 12.0 - 15.0 g/dL 9.5  12.3   Hematocrit 36.0 - 46.0 % 28.0  37.5   Platelets 150 - 400 K/uL 200  272    A/P pod2 s/p ltcs Doing well, contin care; increase ambulation, pain management reviewed; plan d/c home tomorrow Abl anemia - asymptomatic, plan iron q day RH pos Rubella Immune Girl, breastfeeding-working on feeds with lactation

## 2022-04-10 ENCOUNTER — Encounter (HOSPITAL_COMMUNITY): Payer: Self-pay | Admitting: *Deleted

## 2022-04-10 MED ORDER — IBUPROFEN 600 MG PO TABS: 600.0000 mg | ORAL_TABLET | Freq: Four times a day (QID) | ORAL | 0 refills | Status: AC

## 2022-04-10 MED ORDER — ACETAMINOPHEN 500 MG PO TABS: 1000.0000 mg | ORAL_TABLET | Freq: Four times a day (QID) | ORAL | 0 refills | Status: AC

## 2022-04-10 MED ORDER — OXYCODONE HCL 5 MG PO TABS: 5.0000 mg | ORAL_TABLET | Freq: Four times a day (QID) | ORAL | 0 refills | Status: AC | PRN

## 2022-04-10 NOTE — Discharge Summary (Signed)
Postpartum Discharge Summary   Patient Name: Janet Gibbs DOB: Dec 02, 1985 MRN: [id number]  Date of admission: 04/07/2022 Delivery date:04/07/2022  Delivering provider: Langley Gauss A  Date of discharge: 04/10/2022  Admitting diagnosis: Breech presentation [O32.1XX0] Intrauterine pregnancy: [redacted]w[redacted]d     Secondary diagnosis:  Principal Problem:   Postpartum care following cesarean delivery 1/7 Active Problems:   Breech presentation   Status post primary low transverse cesarean section   Positive GBS test  Additional problems: Fetal ventriculomegaly noted at 36 wks, s/p MFM consult.     Discharge diagnosis: Term Pregnancy Delivered                                              Post partum procedures: None Augmentation: N/A Complications: None  Hospital course: Sceduled C/S   37 y.o. yo G1P0 at [redacted]w[redacted]d was admitted to the hospital 04/07/2022 for scheduled cesarean section with the following indication:Malpresentation.Delivery details are as follows:  Membrane Rupture Time/Date: 11:20 AM ,04/07/2022   Delivery Method:C-Section, Low Transverse  Details of operation can be found in separate operative note.  Patient had a postpartum course complicated bynone.  She is ambulating, tolerating a regular diet, passing flatus, and urinating well. Patient is discharged home in stable condition on  04/10/22        Newborn Data: Birth date:04/07/2022  Birth time:11:21 AM  Gender:Female  Living status:Living  Apgars:8 ,9  Weight:3289 g     Magnesium Sulfate received: No BMZ received: No Rhophylac:N/A MMR:N/A T-DaP:Given prenatally Flu: Yes Transfusion:No  Physical exam  Vitals:   04/09/22 0528 04/09/22 1500 04/09/22 2103 04/10/22 0500  BP: 104/65 91/65 108/66 103/68  Pulse: 66 68 74 64  Resp: 18 18 17 18   Temp: 98 F (36.7 C) 98.8 F (37.1 C) 97.9 F (36.6 C) 97.8 F (36.6 C)  TempSrc: Oral  Oral Oral  SpO2: 98%  100%   Weight:      Height:       General: alert,  cooperative, and no distress Lochia: appropriate Uterine Fundus: firm Incision: Healing well with no significant drainage, No significant erythema DVT Evaluation: No evidence of DVT seen on physical exam. Negative Homan's sign. Labs: Lab Results  Component Value Date   WBC 19.0 (H) 04/08/2022   HGB 9.5 (L) 04/08/2022   HCT 28.0 (L) 04/08/2022   MCV 89.2 04/08/2022   PLT 200 04/08/2022       No data to display         Edinburgh Score:    04/08/2022    9:13 PM  Edinburgh Postnatal Depression Scale Screening Tool  I have been able to laugh and see the funny side of things. 0  I have looked forward with enjoyment to things. 0  I have blamed myself unnecessarily when things went wrong. 2  I have been anxious or worried for no good reason. 1  I have felt scared or panicky for no good reason. 1  Things have been getting on top of me. 0  I have been so unhappy that I have had difficulty sleeping. 0  I have felt sad or miserable. 0  I have been so unhappy that I have been crying. 1  The thought of harming myself has occurred to me. 0  Edinburgh Postnatal Depression Scale Total 5      After visit meds:  Allergies as  of 04/10/2022       Reactions   Sulfa Antibiotics Nausea And Vomiting        Medication List     TAKE these medications    acetaminophen 500 MG tablet Commonly known as: TYLENOL Take 2 tablets (1,000 mg total) by mouth every 6 (six) hours.   famotidine 20 MG tablet Commonly known as: PEPCID Take 20 mg by mouth daily as needed for heartburn or indigestion.   hydrocortisone 2.5 % rectal cream Commonly known as: ANUSOL-HC Place 1 Application rectally 2 (two) times daily as needed (discomfort).   ibuprofen 600 MG tablet Commonly known as: ADVIL Take 1 tablet (600 mg total) by mouth every 6 (six) hours.   multivitamin-prenatal 27-0.8 MG Tabs tablet Take 1 tablet by mouth daily with supper.   oxyCODONE 5 MG immediate release tablet Commonly known  as: Oxy IR/ROXICODONE Take 1 tablet (5 mg total) by mouth every 6 (six) hours as needed for moderate pain.               Discharge Care Instructions  (From admission, onward)           Start     Ordered   04/10/22 0000  Discharge wound care:       Comments: Remove honeycomb dressing on 04/14/22   04/10/22 0933             Discharge home in stable condition Infant Feeding: Breast Infant Disposition:home with mother Discharge instruction: per After Visit Summary and Postpartum booklet. Activity: Advance as tolerated. Pelvic rest for 6 weeks.  Diet: routine diet Anticipated Birth Control: Unsure Postpartum Appointment:6 weeks Additional Postpartum F/U:  as needed Future Appointments:No future appointments. Follow up Visit: Pt will call to schedule 6 wk PP visit    04/10/2022 Elveria Royals, MD

## 2022-04-10 NOTE — Progress Notes (Signed)
Subjective: Postpartum Day 3: Cesarean Delivery- Primary elective LTCS at 39.3 wks for Breech, declined version. Girl, breast feeding (plus some formula prm), both doing very well   Patient reports feeling well., some nipple pain, breasts are having more milk,.  Pain well controlled, SOme tearing last night but no depression/ anxiety. .  Ready for discharge home.    Objective: Vital signs in last 24 hours: Temp:  [97.8 F (36.6 C)-98.8 F (37.1 C)] 97.8 F (36.6 C) (01/10 0500) Pulse Rate:  [64-74] 64 (01/10 0500) Resp:  [17-18] 18 (01/10 0500) BP: (91-108)/(65-68) 103/68 (01/10 0500) SpO2:  [100 %] 100 % (01/09 2103)  Physical Exam:  General: alert and cooperative Lochia: appropriate Uterine Fundus: firm Incision: healing well, no significant drainage DVT Evaluation: No evidence of DVT seen on physical exam. Negative Homan's sign.     Latest Ref Rng & Units 04/08/2022    4:42 AM 04/05/2022    9:45 AM  CBC  WBC 4.0 - 10.5 K/uL 19.0  9.7   Hemoglobin 12.0 - 15.0 g/dL 9.5  12.3   Hematocrit 36.0 - 46.0 % 28.0  37.5   Platelets 150 - 400 K/uL 200  272      Assessment/Plan: Status post Cesarean section.  POD #3 , 1st LTCS for breech Post op good, care and warning ss dw pt  PP good, no depression/anxiety, warning ss dw pt. Good support  Anemia - start Iron once BM normal, take stool softner prn  Discharge home. F/up 6 wks and sooner PRN    Elveria Royals, MD 04/10/2022, 7:27 AM

## 2022-04-16 ENCOUNTER — Telehealth (HOSPITAL_COMMUNITY): Payer: Self-pay | Admitting: *Deleted

## 2022-04-16 NOTE — Telephone Encounter (Signed)
Left phone voicemail message.  Odis Hollingshead, RN 04-16-2022 at 9:50am

## 2022-05-16 DIAGNOSIS — Z7251 High risk heterosexual behavior: Secondary | ICD-10-CM | POA: Diagnosis not present

## 2022-05-16 DIAGNOSIS — Z124 Encounter for screening for malignant neoplasm of cervix: Secondary | ICD-10-CM | POA: Diagnosis not present

## 2022-10-02 DIAGNOSIS — R509 Fever, unspecified: Secondary | ICD-10-CM | POA: Diagnosis not present

## 2022-10-02 DIAGNOSIS — J029 Acute pharyngitis, unspecified: Secondary | ICD-10-CM | POA: Diagnosis not present

## 2022-10-05 DIAGNOSIS — R509 Fever, unspecified: Secondary | ICD-10-CM | POA: Diagnosis not present

## 2022-10-05 DIAGNOSIS — B349 Viral infection, unspecified: Secondary | ICD-10-CM | POA: Diagnosis not present

## 2022-10-08 DIAGNOSIS — O9123 Nonpurulent mastitis associated with lactation: Secondary | ICD-10-CM | POA: Diagnosis not present

## 2022-10-08 DIAGNOSIS — R87615 Unsatisfactory cytologic smear of cervix: Secondary | ICD-10-CM | POA: Diagnosis not present

## 2022-10-24 DIAGNOSIS — R310 Gross hematuria: Secondary | ICD-10-CM | POA: Diagnosis not present

## 2022-10-24 DIAGNOSIS — R634 Abnormal weight loss: Secondary | ICD-10-CM | POA: Diagnosis not present

## 2022-10-24 DIAGNOSIS — Z681 Body mass index (BMI) 19 or less, adult: Secondary | ICD-10-CM | POA: Diagnosis not present

## 2022-10-25 ENCOUNTER — Other Ambulatory Visit: Payer: Self-pay | Admitting: Family Medicine

## 2022-10-25 DIAGNOSIS — R310 Gross hematuria: Secondary | ICD-10-CM

## 2022-11-04 DIAGNOSIS — B309 Viral conjunctivitis, unspecified: Secondary | ICD-10-CM | POA: Diagnosis not present

## 2022-11-06 DIAGNOSIS — Z713 Dietary counseling and surveillance: Secondary | ICD-10-CM | POA: Diagnosis not present

## 2022-11-06 DIAGNOSIS — R634 Abnormal weight loss: Secondary | ICD-10-CM | POA: Diagnosis not present

## 2022-11-08 DIAGNOSIS — H1032 Unspecified acute conjunctivitis, left eye: Secondary | ICD-10-CM | POA: Diagnosis not present

## 2022-11-08 DIAGNOSIS — J029 Acute pharyngitis, unspecified: Secondary | ICD-10-CM | POA: Diagnosis not present

## 2022-11-08 DIAGNOSIS — R509 Fever, unspecified: Secondary | ICD-10-CM | POA: Diagnosis not present

## 2022-11-11 ENCOUNTER — Ambulatory Visit
Admission: RE | Admit: 2022-11-11 | Discharge: 2022-11-11 | Disposition: A | Discharge status: Home / Self Care | Payer: Self-pay | Source: Ambulatory Visit | Attending: Family Medicine | Admitting: Family Medicine

## 2022-11-11 DIAGNOSIS — R310 Gross hematuria: Secondary | ICD-10-CM

## 2022-11-11 DIAGNOSIS — R3129 Other microscopic hematuria: Secondary | ICD-10-CM | POA: Diagnosis not present

## 2022-11-13 DIAGNOSIS — Z713 Dietary counseling and surveillance: Secondary | ICD-10-CM | POA: Diagnosis not present

## 2022-11-14 ENCOUNTER — Other Ambulatory Visit: Payer: Self-pay

## 2022-11-20 DIAGNOSIS — L2089 Other atopic dermatitis: Secondary | ICD-10-CM | POA: Diagnosis not present

## 2022-11-20 DIAGNOSIS — U071 COVID-19: Secondary | ICD-10-CM | POA: Diagnosis not present

## 2022-12-01 DIAGNOSIS — A084 Viral intestinal infection, unspecified: Secondary | ICD-10-CM | POA: Diagnosis not present

## 2022-12-04 DIAGNOSIS — Z713 Dietary counseling and surveillance: Secondary | ICD-10-CM | POA: Diagnosis not present

## 2022-12-04 DIAGNOSIS — Z681 Body mass index (BMI) 19 or less, adult: Secondary | ICD-10-CM | POA: Diagnosis not present

## 2022-12-10 DIAGNOSIS — R311 Benign essential microscopic hematuria: Secondary | ICD-10-CM | POA: Diagnosis not present

## 2022-12-12 DIAGNOSIS — Z713 Dietary counseling and surveillance: Secondary | ICD-10-CM | POA: Diagnosis not present

## 2022-12-26 DIAGNOSIS — Z713 Dietary counseling and surveillance: Secondary | ICD-10-CM | POA: Diagnosis not present

## 2023-01-14 DIAGNOSIS — Z713 Dietary counseling and surveillance: Secondary | ICD-10-CM | POA: Diagnosis not present

## 2023-02-06 DIAGNOSIS — Z713 Dietary counseling and surveillance: Secondary | ICD-10-CM | POA: Diagnosis not present

## 2023-03-25 DIAGNOSIS — R0981 Nasal congestion: Secondary | ICD-10-CM | POA: Diagnosis not present

## 2023-03-25 DIAGNOSIS — Z20822 Contact with and (suspected) exposure to covid-19: Secondary | ICD-10-CM | POA: Diagnosis not present

## 2023-03-25 DIAGNOSIS — R059 Cough, unspecified: Secondary | ICD-10-CM | POA: Diagnosis not present

## 2023-03-25 DIAGNOSIS — J029 Acute pharyngitis, unspecified: Secondary | ICD-10-CM | POA: Diagnosis not present

## 2023-05-06 DIAGNOSIS — R197 Diarrhea, unspecified: Secondary | ICD-10-CM | POA: Diagnosis not present

## 2023-05-06 DIAGNOSIS — E876 Hypokalemia: Secondary | ICD-10-CM | POA: Diagnosis not present

## 2023-05-24 DIAGNOSIS — J019 Acute sinusitis, unspecified: Secondary | ICD-10-CM | POA: Diagnosis not present

## 2023-06-29 DIAGNOSIS — H10021 Other mucopurulent conjunctivitis, right eye: Secondary | ICD-10-CM | POA: Diagnosis not present

## 2023-08-19 DIAGNOSIS — Z1331 Encounter for screening for depression: Secondary | ICD-10-CM | POA: Diagnosis not present

## 2023-08-19 DIAGNOSIS — Z01419 Encounter for gynecological examination (general) (routine) without abnormal findings: Secondary | ICD-10-CM | POA: Diagnosis not present

## 2023-11-05 DIAGNOSIS — J029 Acute pharyngitis, unspecified: Secondary | ICD-10-CM | POA: Diagnosis not present

## 2024-02-16 DIAGNOSIS — J069 Acute upper respiratory infection, unspecified: Secondary | ICD-10-CM | POA: Diagnosis not present
